# Patient Record
Sex: Male | Born: 1980 | Race: Black or African American | Hispanic: No | Marital: Single
Health system: Southern US, Community
[De-identification: ages and names within clinical notes are randomized; demographics above are authoritative.]

---

## 2001-10-09 ENCOUNTER — Emergency Department (HOSPITAL_COMMUNITY): Admission: EM | Admit: 2001-10-09 | Discharge: 2001-10-09 | Payer: Self-pay | Admitting: Emergency Medicine

## 2001-11-21 ENCOUNTER — Emergency Department (HOSPITAL_COMMUNITY): Admission: EM | Admit: 2001-11-21 | Discharge: 2001-11-21 | Payer: Self-pay | Admitting: Emergency Medicine

## 2002-11-12 ENCOUNTER — Emergency Department (HOSPITAL_COMMUNITY): Admission: EM | Admit: 2002-11-12 | Discharge: 2002-11-12 | Payer: Self-pay | Admitting: Emergency Medicine

## 2005-07-30 ENCOUNTER — Emergency Department (HOSPITAL_COMMUNITY): Admission: EM | Admit: 2005-07-30 | Discharge: 2005-07-30 | Payer: Self-pay | Admitting: Emergency Medicine

## 2005-07-31 ENCOUNTER — Emergency Department (HOSPITAL_COMMUNITY): Admission: EM | Admit: 2005-07-31 | Discharge: 2005-07-31 | Payer: Self-pay | Admitting: Emergency Medicine

## 2006-05-30 ENCOUNTER — Emergency Department (HOSPITAL_COMMUNITY): Admission: EM | Admit: 2006-05-30 | Discharge: 2006-05-30 | Payer: Self-pay | Admitting: Emergency Medicine

## 2007-05-20 ENCOUNTER — Emergency Department (HOSPITAL_COMMUNITY): Admission: EM | Admit: 2007-05-20 | Discharge: 2007-05-20 | Payer: Self-pay | Admitting: Emergency Medicine

## 2008-07-23 IMAGING — CR DG CHEST 2V
2 series · 2 of 2 positions shown · non-contrast
Comparison: 07/30/05.

CLINICAL DATA: Cough, fever.
 CHEST ? 2 VIEW:

[w chest pa *]
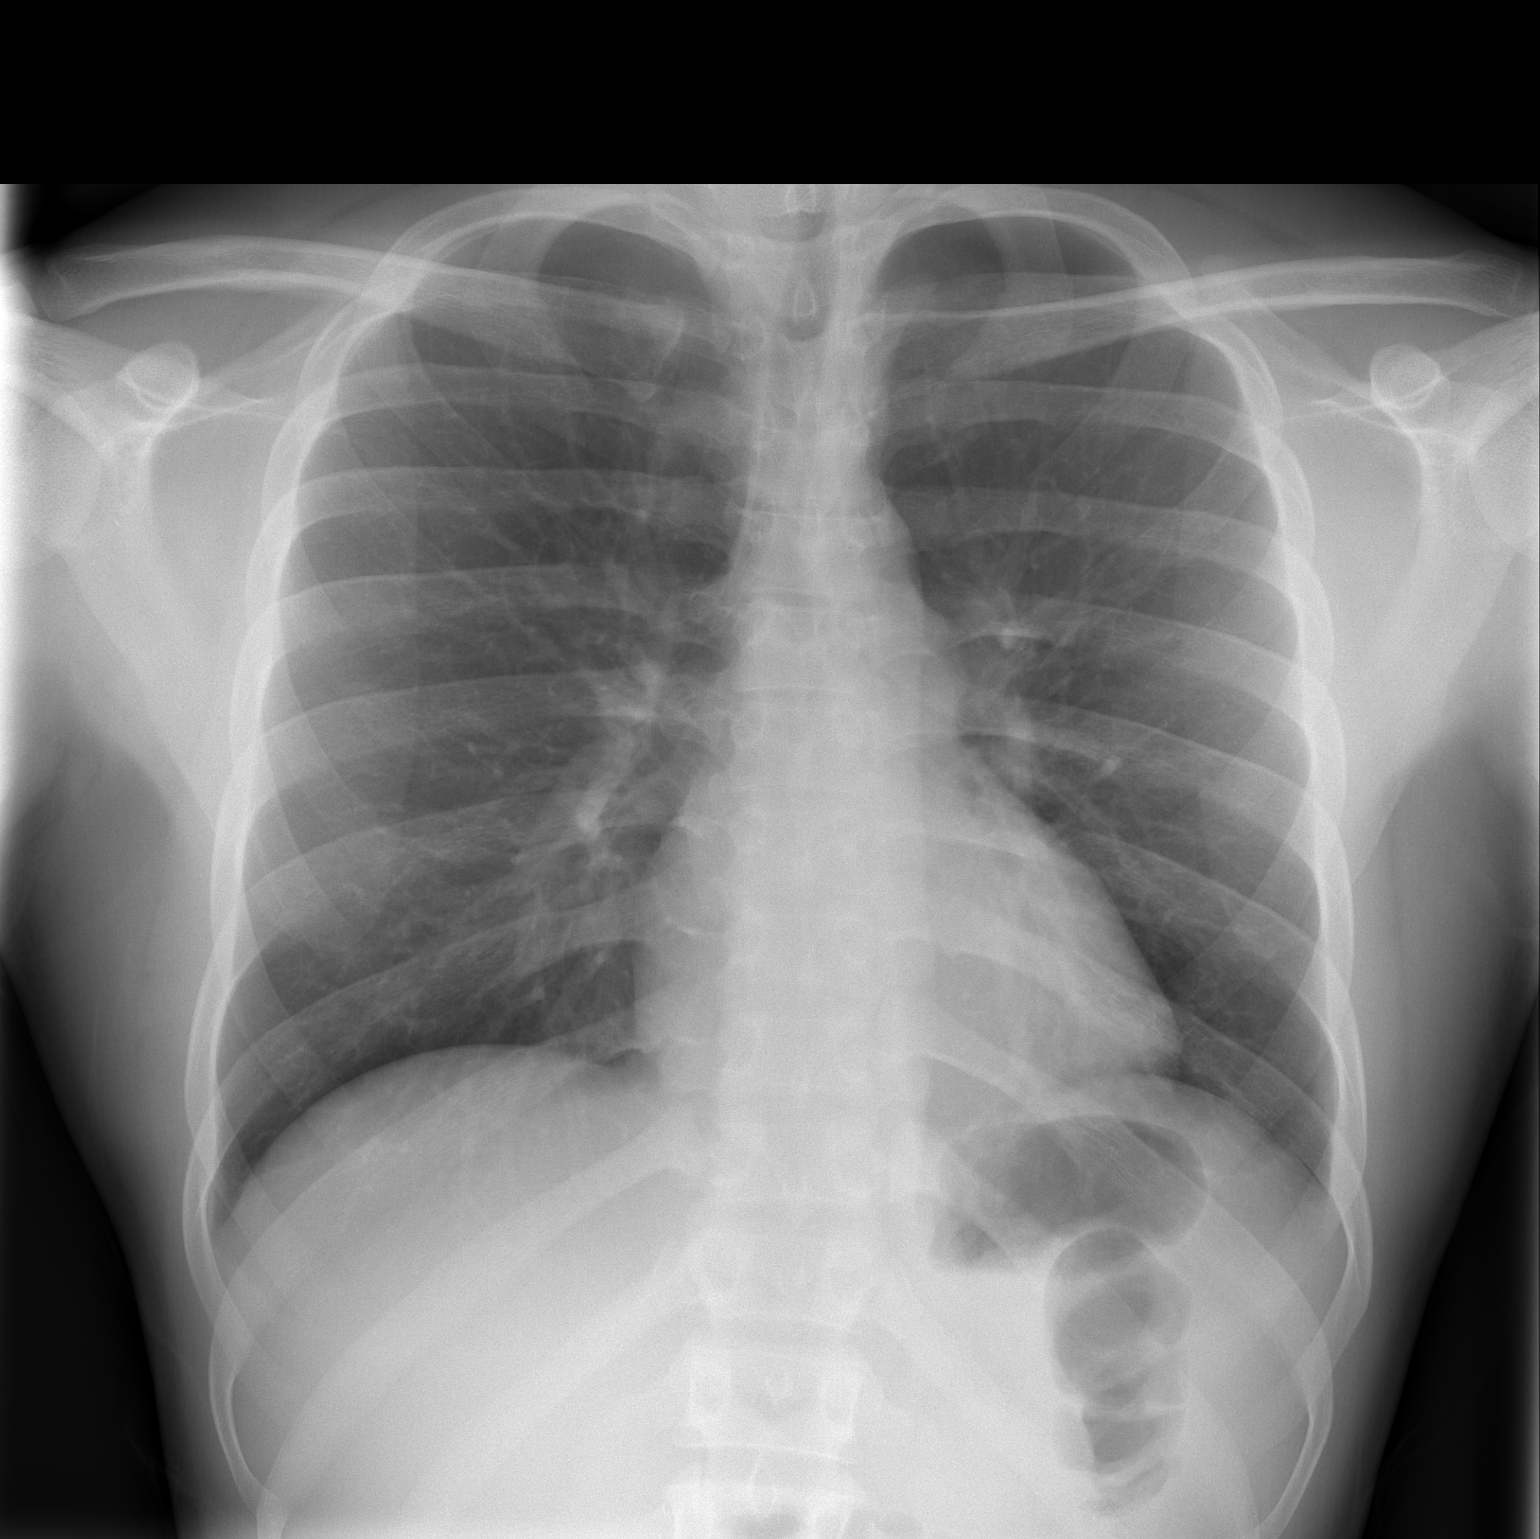

[w chest lat *]
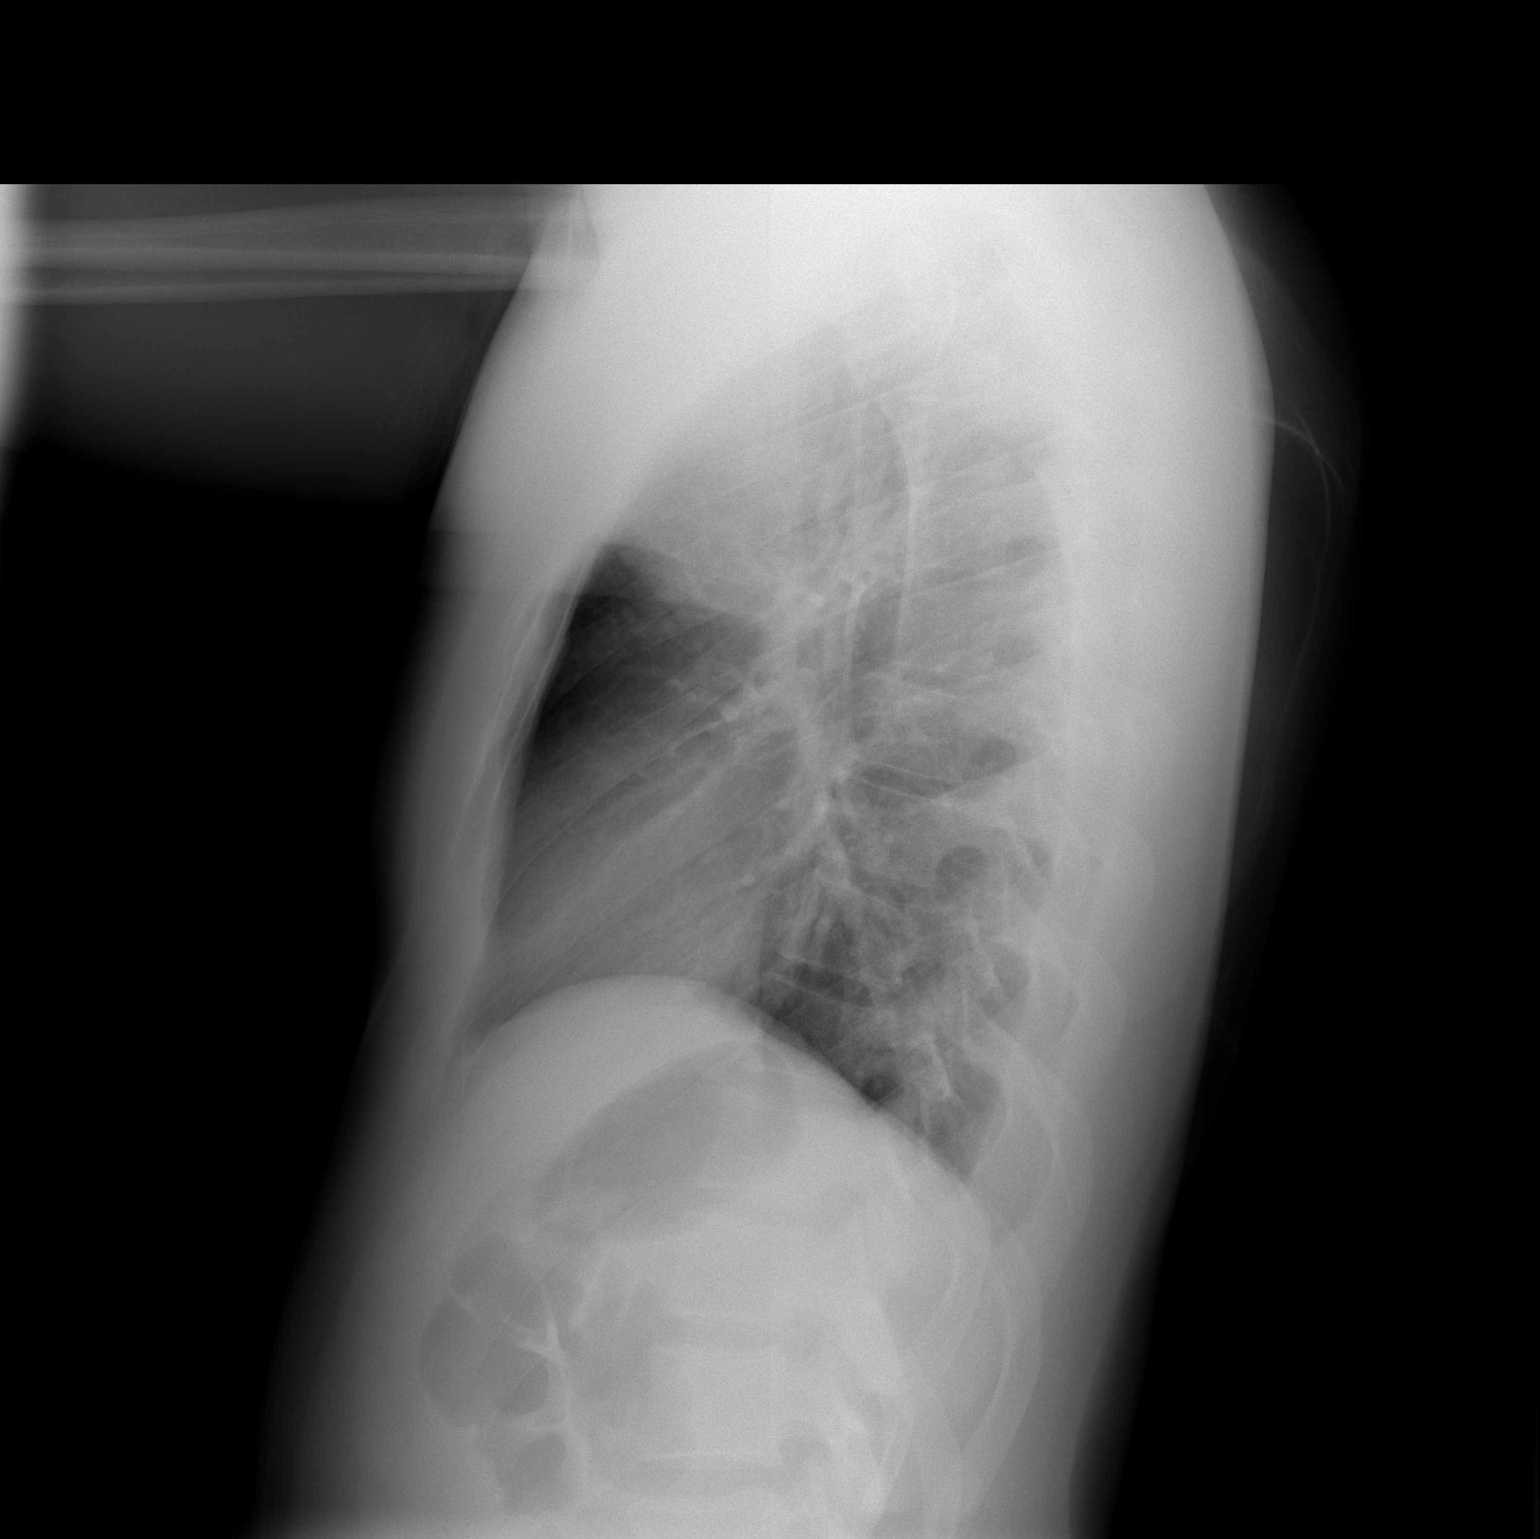

[2 of 2 positions shown; findings below may reference images not displayed]

FINDINGS: The heart size and mediastinal contours are within normal limits.  Both lungs are clear.  The visualized skeletal structures are unremarkable.
IMPRESSION: No active cardiopulmonary disease.

## 2011-01-08 LAB — RPR: RPR Ser Ql: NONREACTIVE

## 2011-01-08 LAB — GC/CHLAMYDIA PROBE AMP, GENITAL
Chlamydia, DNA Probe: NEGATIVE
GC Probe Amp, Genital: POSITIVE — AB

## 2021-06-25 ENCOUNTER — Emergency Department (HOSPITAL_COMMUNITY): Payer: Self-pay

## 2021-06-25 ENCOUNTER — Emergency Department (HOSPITAL_COMMUNITY)
Admission: EM | Admit: 2021-06-25 | Discharge: 2021-06-25 | Disposition: A | Discharge status: Home / Self Care | Payer: Self-pay | Attending: Emergency Medicine | Admitting: Emergency Medicine

## 2021-06-25 ENCOUNTER — Other Ambulatory Visit: Payer: Self-pay

## 2021-06-25 ENCOUNTER — Encounter (HOSPITAL_COMMUNITY): Payer: Self-pay | Admitting: Emergency Medicine

## 2021-06-25 DIAGNOSIS — Z21 Asymptomatic human immunodeficiency virus [HIV] infection status: Secondary | ICD-10-CM | POA: Insufficient documentation

## 2021-06-25 DIAGNOSIS — J069 Acute upper respiratory infection, unspecified: Secondary | ICD-10-CM | POA: Insufficient documentation

## 2021-06-25 DIAGNOSIS — Z20822 Contact with and (suspected) exposure to covid-19: Secondary | ICD-10-CM | POA: Insufficient documentation

## 2021-06-25 LAB — COMPREHENSIVE METABOLIC PANEL
ALT: 64 U/L — ABNORMAL HIGH (ref 0–44)
AST: 47 U/L — ABNORMAL HIGH (ref 15–41)
Albumin: 4.4 g/dL (ref 3.5–5.0)
Alkaline Phosphatase: 106 U/L (ref 38–126)
Anion gap: 8 (ref 5–15)
BUN: 16 mg/dL (ref 6–20)
CO2: 28 mmol/L (ref 22–32)
Calcium: 9.3 mg/dL (ref 8.9–10.3)
Chloride: 104 mmol/L (ref 98–111)
Creatinine, Ser: 1.11 mg/dL (ref 0.61–1.24)
GFR, Estimated: >60 mL/min (ref >60)
Glucose, Bld: 85 mg/dL (ref 70–99)
Potassium: 4.4 mmol/L (ref 3.5–5.1)
Sodium: 140 mmol/L (ref 135–145)
Total Bilirubin: 0.5 mg/dL (ref 0.3–1.2)
Total Protein: 8.3 g/dL — ABNORMAL HIGH (ref 6.5–8.1)

## 2021-06-25 LAB — CBC WITH DIFFERENTIAL/PLATELET
Abs Immature Granulocytes: 0.01 10*3/uL (ref 0.00–0.07)
Basophils Absolute: 0 10*3/uL (ref 0.0–0.1)
Basophils Relative: 1 %
Eosinophils Absolute: 0.1 10*3/uL (ref 0.0–0.5)
Eosinophils Relative: 2 %
HCT: 43.6 % (ref 39.0–52.0)
Hemoglobin: 14.4 g/dL (ref 13.0–17.0)
Immature Granulocytes: 0 %
Lymphocytes Relative: 39 %
Lymphs Abs: 2 10*3/uL (ref 0.7–4.0)
MCH: 28.7 pg (ref 26.0–34.0)
MCHC: 33 g/dL (ref 30.0–36.0)
MCV: 87 fL (ref 80.0–100.0)
Monocytes Absolute: 0.5 10*3/uL (ref 0.1–1.0)
Monocytes Relative: 10 %
Neutro Abs: 2.5 10*3/uL (ref 1.7–7.7)
Neutrophils Relative %: 48 %
Platelets: 287 10*3/uL (ref 150–400)
RBC: 5.01 MIL/uL (ref 4.22–5.81)
RDW: 12.8 % (ref 11.5–15.5)
WBC: 5.2 10*3/uL (ref 4.0–10.5)
nRBC: 0 % (ref 0.0–0.2)

## 2021-06-25 LAB — RESP PANEL BY RT-PCR (FLU A&B, COVID) ARPGX2
Influenza A by PCR: NEGATIVE
Influenza B by PCR: NEGATIVE
SARS Coronavirus 2 by RT PCR: NEGATIVE

## 2021-06-25 LAB — LIPASE, BLOOD: Lipase: 33 U/L (ref 11–51)

## 2021-06-25 NOTE — ED Provider Triage Note (Signed)
Emergency Medicine Provider Triage Evaluation Note ? ?Curtis Swanson , a 41 y.o. male  was evaluated in triage.  Pt complains of \\not  feeling well.  Approximately 1 week ago developed cough, shortness of breath, diarrhea.  Initially got better however returned a few days ago.  Having multiple episodes of loose stool daily without any melena or bright blood per rectum.  No recent antibiotics or travel.  Cough productive of clear sputum.Marland Kitchen  No chest pain, back pain, lower extremity swelling.  By [facility] for [facility] ? ?Review of Systems  ?Positive: Cough, shortness of breath, diarrhea ?Negative: Fever, emesis ? ?Physical Exam  ?BP (!) 155/96 (BP Location: Left Arm)   Pulse 72   Temp 98.7 ?F (37.1 ?C) (Oral)   Resp 16   Ht 6' (1.829 m)   Wt 108.9 kg   SpO2 96%   BMI 32.55 kg/m?  ?Gen:   Awake, no distress   ?Resp:  Normal effort  ?MSK:   Moves extremities without difficulty  ?Other:   ? ?Medical Decision Making  ?Medically screening exam initiated at 7:17 PM.  Appropriate orders placed.  Curtis Swanson was informed that the remainder of the evaluation will be completed by another provider, this initial triage assessment does not replace that evaluation, and the importance of remaining in the ED until their evaluation is complete. ? ?Cough, sob, diarrhea ?  ?Nadirah Socorro A, PA-C ?06/25/21 1918 ? ?

## 2021-06-25 NOTE — ED Provider Notes (Signed)
?[facility]-EMERGENCY DEPT ?Provider Note ? ? ?CSN: [id number] ?Arrival date & time: 06/25/21  1851 ? ?  ? ?History ? ?Chief Complaint  ?Patient presents with  ? Cough  ? Nasal Congestion  ? Shortness of Breath  ? Diarrhea  ? ? ?Curtis Swanson is a 41 y.o. male. ? ?Patient with history of HIV, CD4 count 1 year ago 697 presents to the emergency department for evaluation of multiple symptoms over the past 2 days.  Patient reports having dry cough, nasal congestion, diarrhea without blood, headache.  No known sick contacts but states he does work around other people.  Denies ear pain, sore throat, fevers or chills.  No chest pain.  No abdominal pain or urinary symptoms.  No skin rash or leg swelling.  Patient does report shortness of breath but states that this is with activity.  It has not stopped him from performing any activities.  States that he just wanted to get checked out to make sure he was okay.  He has been taking Mucinex and other over-the-counter cough and cold medications prior to arrival. ? ? ?  ? ?Home Medications ?Prior to Admission medications   ?Not on File  ?   ? ?Allergies    ?Patient has no allergy information on record.   ? ?Review of Systems   ?Review of Systems ? ?Physical Exam ?Updated Vital Signs ?BP 139/90   Pulse (!) 58   Temp 98.7 ?F (37.1 ?C) (Oral)   Resp 16   Ht 6' (1.829 m)   Wt 108.9 kg   SpO2 100%   BMI 32.55 kg/m?  ?Physical Exam ?Vitals and nursing note reviewed.  ?Constitutional:   ?   Appearance: He is well-developed.  ?HENT:  ?   Head: Normocephalic and atraumatic.  ?   Jaw: No trismus.  ?   Right Ear: Tympanic membrane, ear canal and external ear normal.  ?   Left Ear: Tympanic membrane, ear canal and external ear normal.  ?   Nose: Nose normal. No mucosal edema or rhinorrhea.  ?   Mouth/Throat:  ?   Mouth: Mucous membranes are not dry.  ?   Pharynx: Uvula midline. No oropharyngeal exudate, posterior oropharyngeal erythema or uvula swelling.  ?    Tonsils: No tonsillar abscesses.  ?Eyes:  ?   General:     ?   Right eye: No discharge.     ?   Left eye: No discharge.  ?   Conjunctiva/sclera: Conjunctivae normal.  ?Cardiovascular:  ?   Rate and Rhythm: Normal rate and regular rhythm.  ?   Heart sounds: Normal heart sounds.  ?Pulmonary:  ?   Effort: Pulmonary effort is normal. No respiratory distress.  ?   Breath sounds: Normal breath sounds. No wheezing or rales.  ?Abdominal:  ?   Palpations: Abdomen is soft.  ?   Tenderness: There is no abdominal tenderness.  ?Musculoskeletal:  ?   Cervical back: Normal range of motion and neck supple.  ?   Right lower leg: No tenderness. No edema.  ?   Left lower leg: No tenderness. No edema.  ?Skin: ?   General: Skin is warm and dry.  ?Neurological:  ?   Mental Status: He is alert.  ? ? ?ED Results / Procedures / Treatments   ?Labs ?(all labs ordered are listed, but only abnormal results are displayed) ?Labs Reviewed  ?COMPREHENSIVE METABOLIC PANEL - Abnormal; Notable for the following components:  ?  Result Value  ? Total Protein 8.3 (*)   ? AST 47 (*)   ? ALT 64 (*)   ? All other components within normal limits  ?RESP PANEL BY RT-PCR (FLU A&B, COVID) [id number]  ?CBC WITH DIFFERENTIAL/PLATELET  ?LIPASE, BLOOD  ? ? ?ED ECG REPORT ? ? Date: 06/25/2021 ? Rate: 60 ? Rhythm: normal sinus rhythm ? QRS Axis: normal ? Intervals: normal ? ST/T Wave abnormalities: normal ? Conduction Disutrbances:none ? Narrative Interpretation: reads borderline ST seg ? Old EKG Reviewed: none available ? ?I have personally reviewed the EKG tracing and agree with the computerized printout as noted. ? ? ?Radiology ?DG Chest 2 View ? ?Result Date: 06/25/2021 ?CLINICAL DATA:  Cough and shortness of breath, history of HIV positivity EXAM: CHEST - 2 VIEW COMPARISON:  05/30/2006 FINDINGS: The heart size and mediastinal contours are within normal limits. Both lungs are clear. The visualized skeletal structures are unremarkable. IMPRESSION: No active  cardiopulmonary disease. Electronically Signed   By: Inez Catalina M.D.   On: 06/25/2021 20:13   ? ?Procedures ?Procedures  ? ? ?Medications Ordered in ED ?Medications - No data to display ? ?ED Course/ Medical Decision Making/ A&P ?  ? ?Patient seen and examined. History obtained directly from patient and from external labs and clinic notes outside of [facility] system.  This included HIV labs.  Work-up including labs, imaging, EKG ordered in triage, if performed, were reviewed.   ? ?Labs/EKG: Independently reviewed and interpreted.  This included: CBC which was normal; CMP which showed minimally elevated liver function testing; normal lipase; normal flu and COVID testing.  ? ?Imaging: Independently visualized and interpreted.  This included: Chest x-ray, agree normal. ? ?Medications/Fluids: None ? ?Most recent vital signs reviewed and are as follows: ?BP 139/90   Pulse (!) 58   Temp 98.7 ?F (37.1 ?C) (Oral)   Resp 16   Ht 6' (1.829 m)   Wt 108.9 kg   SpO2 100%   BMI 32.55 kg/m?  ? ?Initial impression: Viral respiratory infection ? ?Most current vital signs reviewed and are as follows: ?BP (!) 143/102   Pulse (!) 58   Temp 98.7 ?F (37.1 ?C) (Oral)   Resp 16   Ht 6' (1.829 m)   Wt 108.9 kg   SpO2 99%   BMI 32.55 kg/m?  ? ?Plan: Discharge to home.  ? ?Prescriptions written for: None ? ?Other home care instructions discussed: Discussed treatment control at home, OTC meds ? ?ED return instructions discussed: Return with worsening shortness of breath, trouble breathing, increased work of breathing, high fevers, persistent vomiting, blood in stool, other concerns. ? ?Follow-up instructions discussed: Patient encouraged to follow-up with their PCP in 3-5 days if not improving.  ? ? ? ? ? ?                        ?Medical Decision Making ? ?Patient with clinical symptoms concerning for respiratory infection.  No flu or COVID.  Chest x-ray without signs of pneumonia.  EKG with borderline ST segment changes, but  patient does not have any chest pain.  Do not suspect ACS, STEMI, pericarditis in this setting.  No indication for troponin at this time.  No fevers or chest pain to suggest myocarditis.  He will continue supportive care at home. ? ? ? ? ? ? ? ?Final Clinical Impression(s) / ED Diagnoses ?Final diagnoses:  ?Viral upper respiratory tract infection with cough  ? ? ?Rx / DC  Orders ?ED Discharge Orders   ? ? None  ? ?  ? ? ?  ?Carlisle Cater, PA-C ?06/25/21 2311 ? ?  ?Fatima Blank, MD ?06/26/21 [phone] ? ?

## 2021-06-25 NOTE — ED Triage Notes (Signed)
Pt reports with cough, congestion, diarrhea, and SHOB x 2 days.  ?

## 2021-06-25 NOTE — Discharge Instructions (Signed)
Please read and follow all provided instructions. ? ?Your diagnoses today include:  ?1. Viral upper respiratory tract infection with cough   ? ? ?You appear to have an upper respiratory infection (URI). An upper respiratory tract infection, or cold, is a viral infection of the air passages leading to the lungs. It should improve gradually after 5-7 days. You may have a lingering cough that lasts for 2- 4 weeks after the infection. ? ?Tests performed today include: ?Vital signs. See below for your results today.  ?COVID/flu testing: Was negative ?Blood cell counts and electrolytes: Were normal ?Liver function testing: Minimally elevated liver tests ?Chest x-ray: No signs of pneumonia or other problems ?EKG: No abnormal heart rhythms ? ?Medications prescribed:  ?None ? ?Take any prescribed medications only as directed. Treatment for your infection is aimed at treating the symptoms. There are no medications, such as antibiotics, that will cure your infection.  ? ?Home care instructions:  ?You can take Tylenol and/or Ibuprofen as directed on the packaging for fever reduction and pain relief.  ?  ?For cough: honey 1/2 to 1 teaspoon (you can dilute the honey in water or another fluid).  You can also use guaifenesin and dextromethorphan for cough. You can use a humidifier for chest congestion and cough.  If you don't have a humidifier, you can sit in the bathroom with the hot shower running.    ?  ?For sore throat: try warm salt water gargles, cepacol lozenges, throat spray, warm tea or water with lemon/honey, popsicles or ice, or OTC cold relief medicine for throat discomfort.  ?  ?For congestion: take a daily anti-histamine like Zyrtec, Claritin, and a oral decongestant, such as pseudoephedrine.  You can also use Flonase 1-2 sprays in each nostril daily.  ?  ?It is important to stay hydrated: drink plenty of fluids (water, gatorade/powerade/pedialyte, juices, or teas) to keep your throat moisturized and help further  relieve irritation/discomfort.  ? ?Your illness is contagious and can be spread to others, especially during the first 3 or 4 days. It cannot be cured by antibiotics or other medicines. Take basic precautions such as washing your hands often, covering your mouth when you cough or sneeze, and avoiding public places where you could spread your illness to others.  ? ?Please continue drinking plenty of fluids.  Use over-the-counter medicines as needed as directed on packaging for symptom relief.  You may also use ibuprofen or tylenol as directed on packaging for pain or fever.  Do not take multiple medicines containing Tylenol or acetaminophen to avoid taking too much of this medication. ? ?Follow-up instructions: ?Please follow-up with your primary care provider in the next 3 days for further evaluation of your symptoms if you are not feeling better.  ? ?Return instructions:  ?Please return to the Emergency Department if you experience worsening symptoms.  ?RETURN IMMEDIATELY IF you develop shortness of breath, confusion or altered mental status, a new rash, become dizzy, faint, or poorly responsive, or are unable to be cared for at home. ?Please return if you have persistent vomiting and cannot keep down fluids or develop a fever that is not controlled by tylenol or motrin.   ?Please return if you have any other emergent concerns. ? ?Additional Information: ? ?Your vital signs today were: ?BP (!) 143/94   Pulse 62   Temp 98.7 ?F (37.1 ?C) (Oral)   Resp 17   Ht 6' (1.829 m)   Wt 108.9 kg   SpO2 100%   BMI  32.55 kg/m?  ?If your blood pressure (BP) was elevated above 135/85 this visit, please have this repeated by your doctor within one month. ?-------------- ? ?

## 2023-08-19 IMAGING — CR DG CHEST 2V
2 series · 2 of 2 positions shown · non-contrast
Comparison: 05/30/2006

CLINICAL DATA: Cough and shortness of breath, history of HIV
positivity

EXAM:
CHEST - 2 VIEW

[w chest pa]
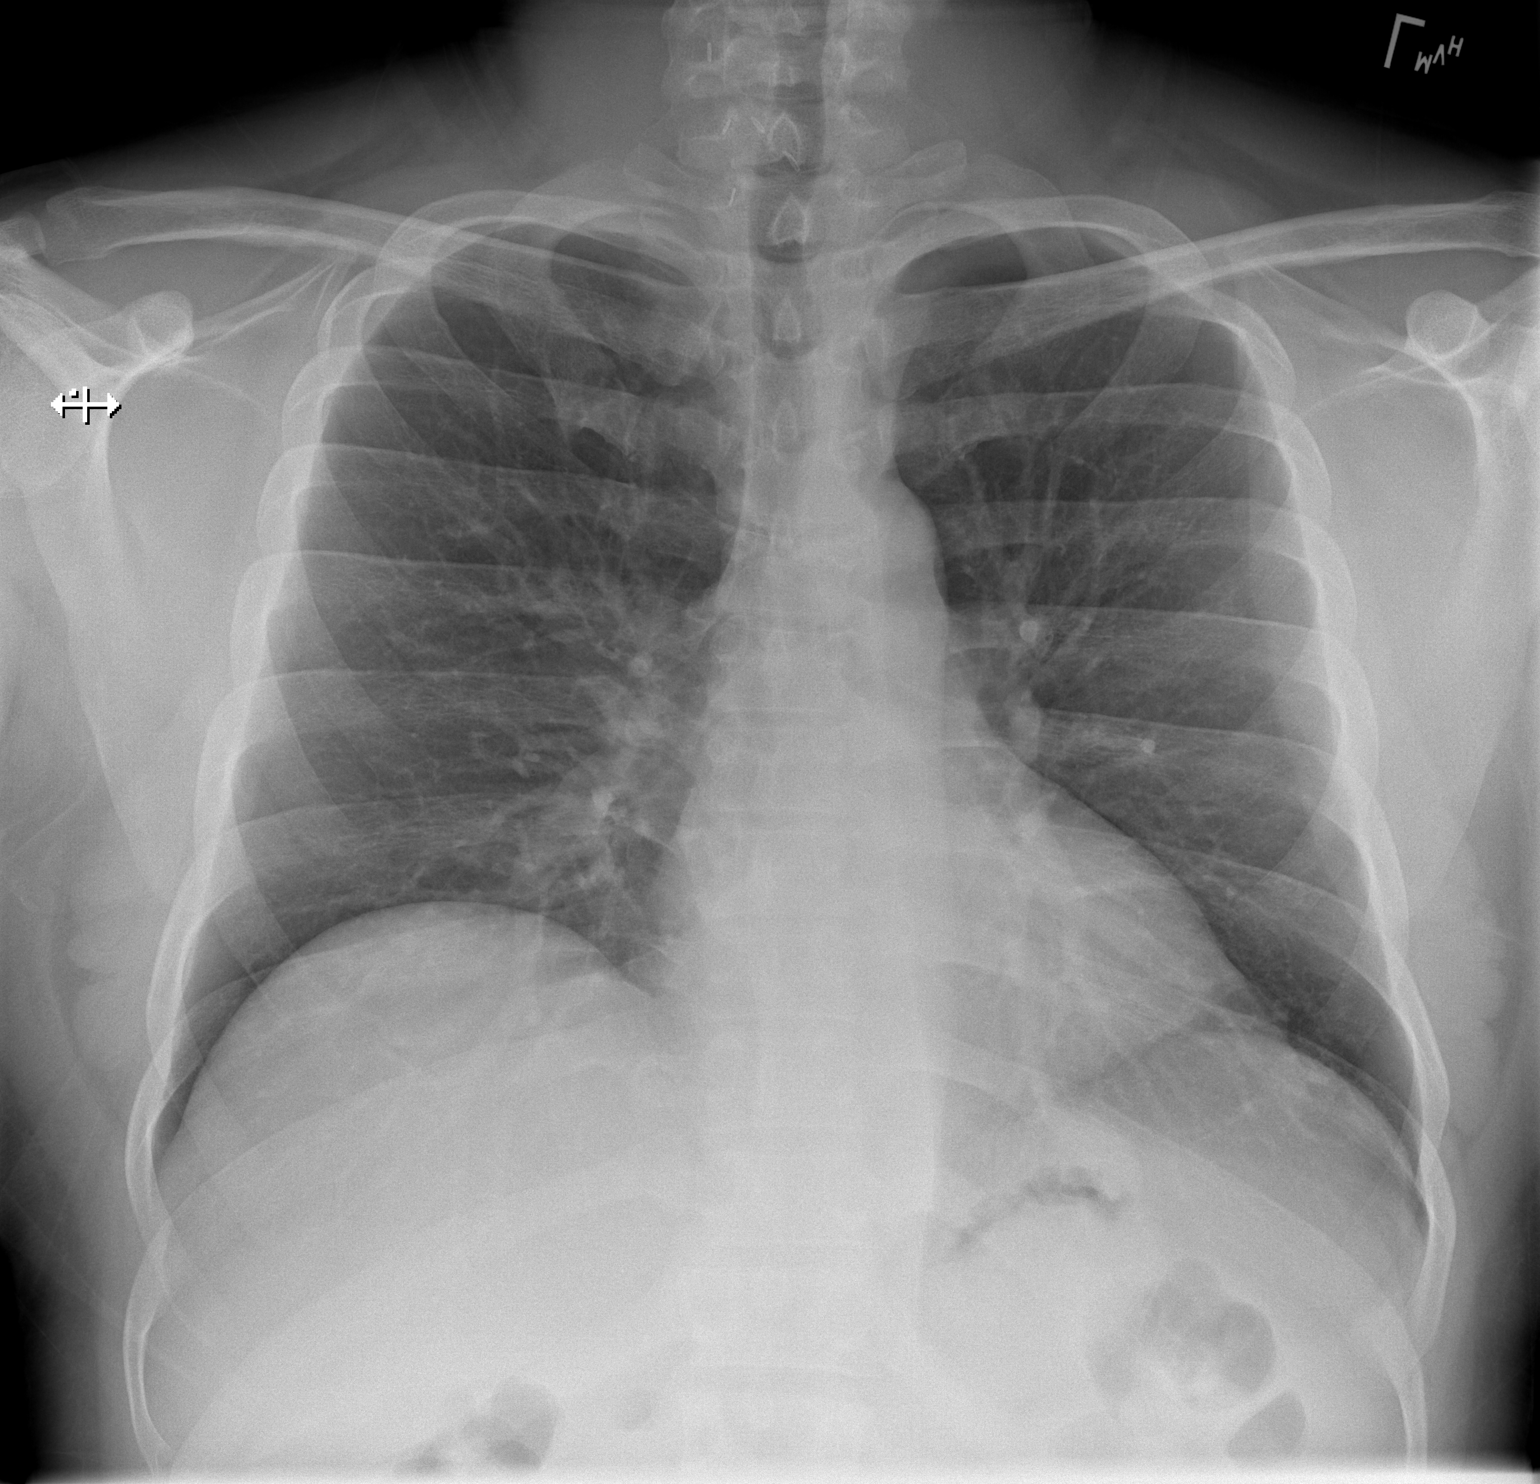

[w chest lat]
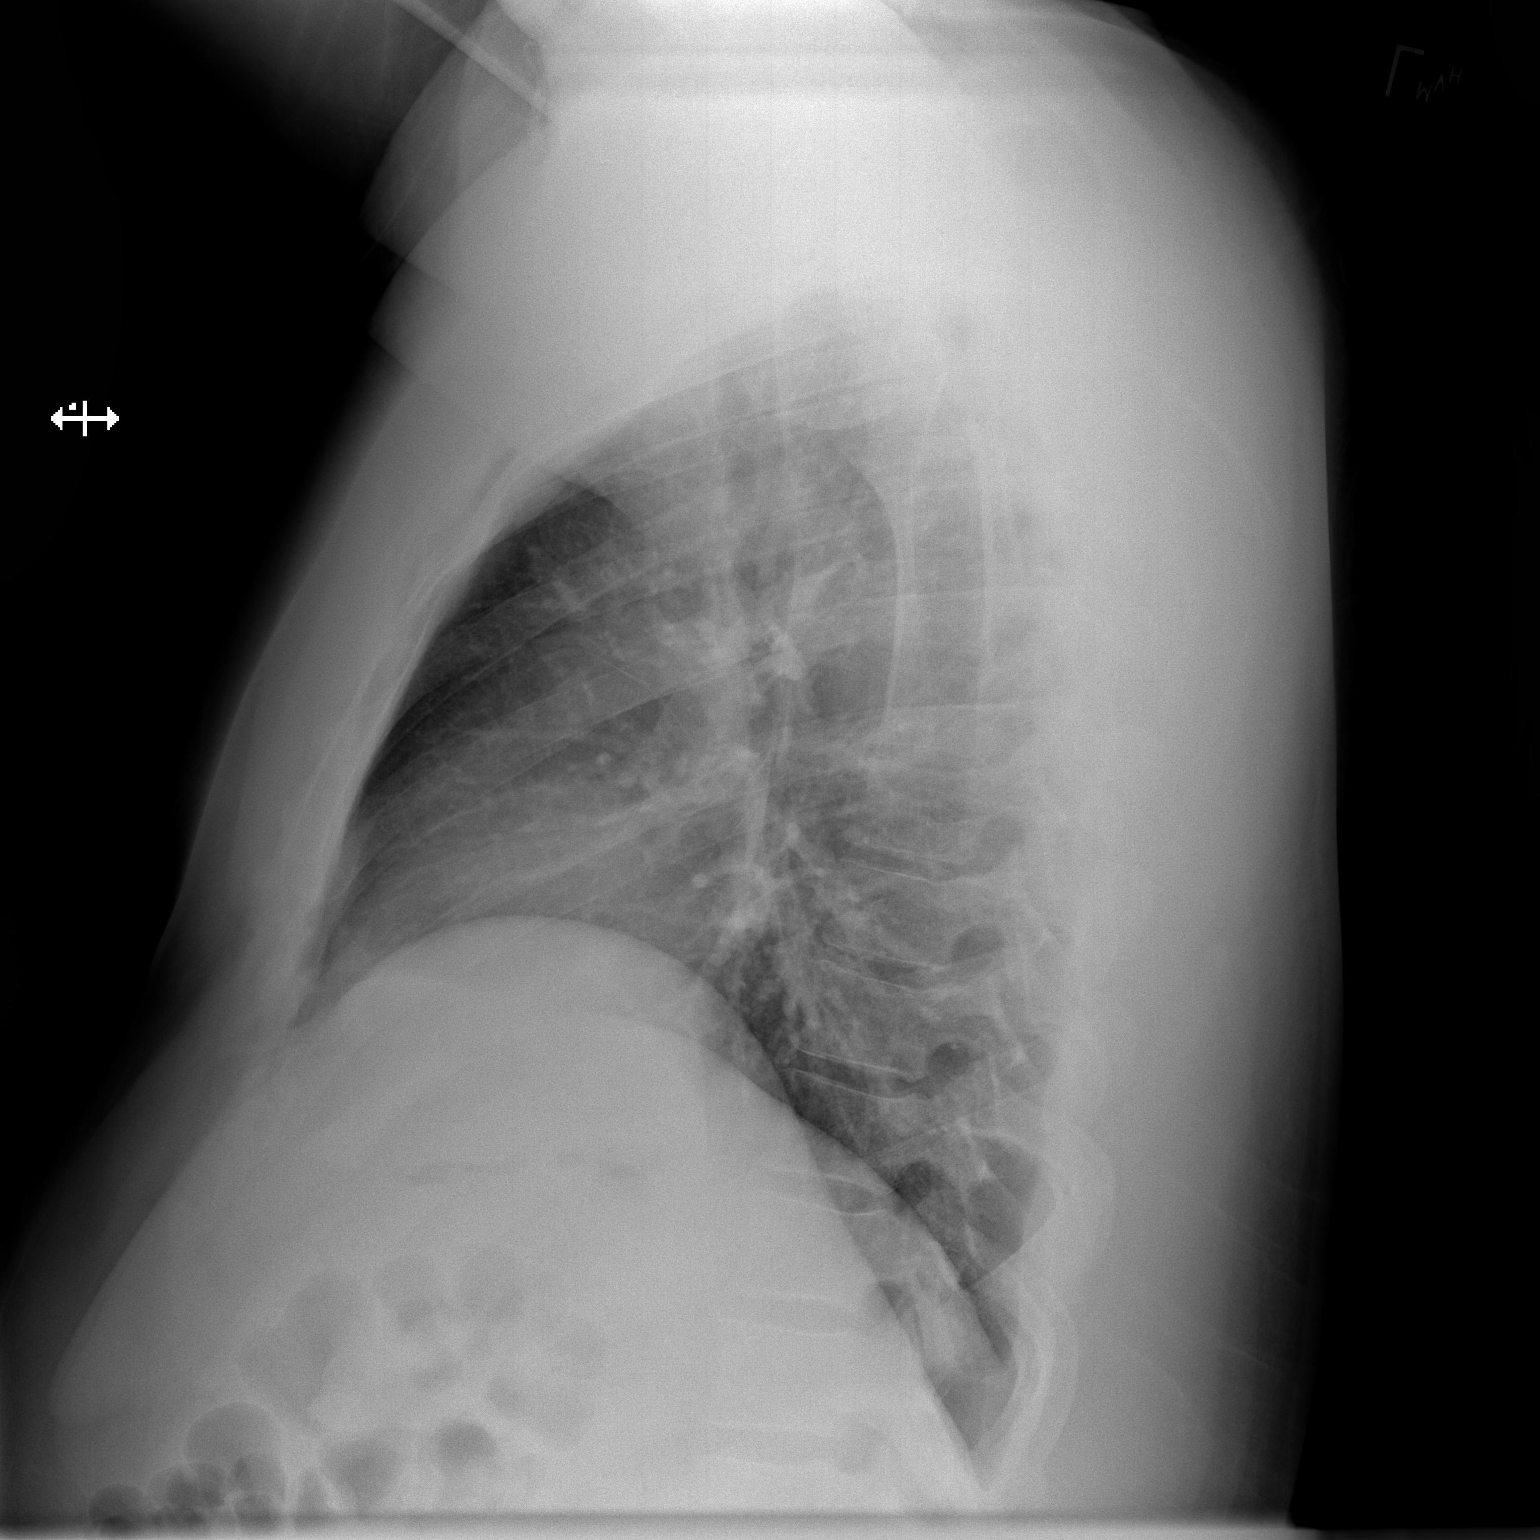

[2 of 2 positions shown; findings below may reference images not displayed]

FINDINGS: The heart size and mediastinal contours are within normal limits.
Both lungs are clear. The visualized skeletal structures are
unremarkable.
IMPRESSION: No active cardiopulmonary disease.
